# Patient Record
Sex: Female | Born: 1960 | Race: White | Hispanic: No | Marital: Married | Smoking: Never smoker
Health system: Southern US, Community
[De-identification: ages and names within clinical notes are randomized; demographics above are authoritative.]

## PROBLEM LIST (undated history)

## (undated) DIAGNOSIS — N200 Calculus of kidney: Secondary | ICD-10-CM

## (undated) DIAGNOSIS — J302 Other seasonal allergic rhinitis: Secondary | ICD-10-CM

## (undated) DIAGNOSIS — K635 Polyp of colon: Secondary | ICD-10-CM

## (undated) DIAGNOSIS — M8580 Other specified disorders of bone density and structure, unspecified site: Secondary | ICD-10-CM

## (undated) DIAGNOSIS — E559 Vitamin D deficiency, unspecified: Secondary | ICD-10-CM

## (undated) DIAGNOSIS — K5790 Diverticulosis of intestine, part unspecified, without perforation or abscess without bleeding: Secondary | ICD-10-CM

## (undated) HISTORY — PX: BREAST SURGERY: SHX581

## (undated) HISTORY — DX: Polyp of colon: K63.5

## (undated) HISTORY — DX: Vitamin D deficiency, unspecified: E55.9

## (undated) HISTORY — DX: Other seasonal allergic rhinitis: J30.2

## (undated) HISTORY — DX: Diverticulosis of intestine, part unspecified, without perforation or abscess without bleeding: K57.90

## (undated) HISTORY — DX: Calculus of kidney: N20.0

## (undated) HISTORY — DX: Other specified disorders of bone density and structure, unspecified site: M85.80

---
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
## 2009-10-17 ENCOUNTER — Encounter: Admission: RE | Admit: 2009-10-17 | Discharge: 2009-10-17 | Payer: Self-pay | Admitting: Family Medicine

## 2011-03-12 ENCOUNTER — Other Ambulatory Visit: Payer: Self-pay | Admitting: Family Medicine

## 2011-03-12 DIAGNOSIS — Z1231 Encounter for screening mammogram for malignant neoplasm of breast: Secondary | ICD-10-CM

## 2011-03-17 ENCOUNTER — Ambulatory Visit
Admission: RE | Admit: 2011-03-17 | Discharge: 2011-03-17 | Disposition: A | Discharge status: Home / Self Care | Payer: 59 | Source: Ambulatory Visit | Attending: Family Medicine | Admitting: Family Medicine

## 2011-03-17 DIAGNOSIS — Z1231 Encounter for screening mammogram for malignant neoplasm of breast: Secondary | ICD-10-CM

## 2011-09-18 ENCOUNTER — Ambulatory Visit (HOSPITAL_COMMUNITY)
Admission: RE | Admit: 2011-09-18 | Discharge: 2011-09-18 | Disposition: A | Discharge status: Home / Self Care | Payer: 59 | Source: Ambulatory Visit | Attending: Family Medicine | Admitting: Family Medicine

## 2011-09-18 ENCOUNTER — Encounter (HOSPITAL_COMMUNITY): Payer: Self-pay

## 2011-09-18 ENCOUNTER — Other Ambulatory Visit: Payer: Self-pay | Admitting: Family Medicine

## 2011-09-18 DIAGNOSIS — N200 Calculus of kidney: Secondary | ICD-10-CM

## 2011-09-18 DIAGNOSIS — D259 Leiomyoma of uterus, unspecified: Secondary | ICD-10-CM | POA: Insufficient documentation

## 2011-09-18 DIAGNOSIS — R109 Unspecified abdominal pain: Secondary | ICD-10-CM | POA: Insufficient documentation

## 2011-09-18 DIAGNOSIS — M949 Disorder of cartilage, unspecified: Secondary | ICD-10-CM | POA: Insufficient documentation

## 2011-09-18 DIAGNOSIS — M5126 Other intervertebral disc displacement, lumbar region: Secondary | ICD-10-CM | POA: Insufficient documentation

## 2011-09-18 DIAGNOSIS — N201 Calculus of ureter: Secondary | ICD-10-CM | POA: Insufficient documentation

## 2011-09-18 DIAGNOSIS — M51379 Other intervertebral disc degeneration, lumbosacral region without mention of lumbar back pain or lower extremity pain: Secondary | ICD-10-CM | POA: Insufficient documentation

## 2011-09-18 DIAGNOSIS — M899 Disorder of bone, unspecified: Secondary | ICD-10-CM | POA: Insufficient documentation

## 2011-09-18 DIAGNOSIS — M5137 Other intervertebral disc degeneration, lumbosacral region: Secondary | ICD-10-CM | POA: Insufficient documentation

## 2012-03-31 ENCOUNTER — Encounter: Payer: 59 | Admitting: Gastroenterology

## 2012-08-26 DIAGNOSIS — N200 Calculus of kidney: Secondary | ICD-10-CM | POA: Insufficient documentation

## 2012-08-26 DIAGNOSIS — M8580 Other specified disorders of bone density and structure, unspecified site: Secondary | ICD-10-CM | POA: Insufficient documentation

## 2012-09-21 ENCOUNTER — Telehealth: Payer: Self-pay | Admitting: Medical Oncology

## 2012-09-21 NOTE — Telephone Encounter (Signed)
erroneous

## 2012-10-13 DIAGNOSIS — K5790 Diverticulosis of intestine, part unspecified, without perforation or abscess without bleeding: Secondary | ICD-10-CM

## 2012-10-13 HISTORY — DX: Diverticulosis of intestine, part unspecified, without perforation or abscess without bleeding: K57.90

## 2012-10-22 DIAGNOSIS — K635 Polyp of colon: Secondary | ICD-10-CM

## 2012-10-22 HISTORY — DX: Polyp of colon: K63.5

## 2013-02-28 IMAGING — CT CT ABD-PELV W/O CM
2 of 4 series · 16 of 46 positions shown, 18 images · non-contrast
Comparison: None.

CLINICAL DATA: Right flank pain

CT ABDOMEN AND PELVIS WITHOUT CONTRAST
TECHNIQUE: Multidetector CT imaging of the abdomen and pelvis was
performed following the standard protocol without intravenous
contrast.

[Series 2: under 200# stone no prev · axial · 0.67mm/px · z∈[-434,-74]mm · 13 of 78 slices shown, 15 images]
[im 3/78  soft-tissue]
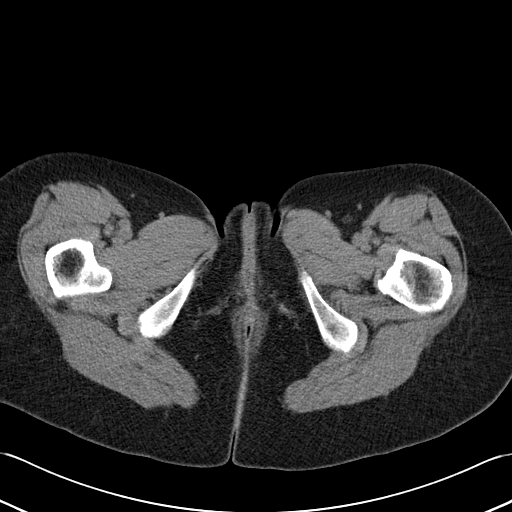
[im 3/78  bone]
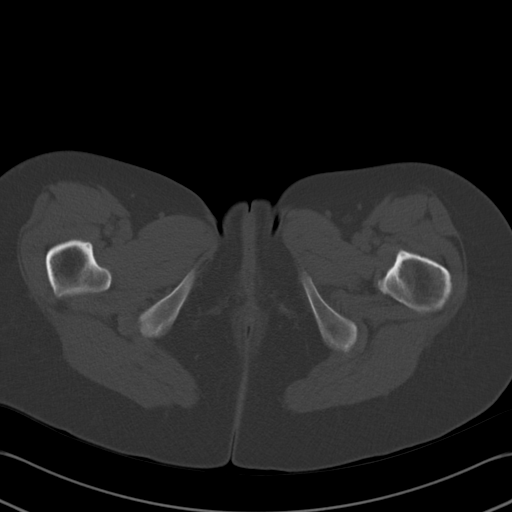
[im 9/78  soft-tissue]
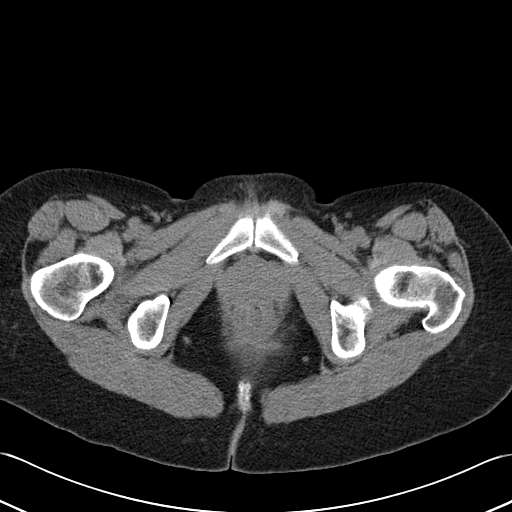
[im 15/78  soft-tissue]
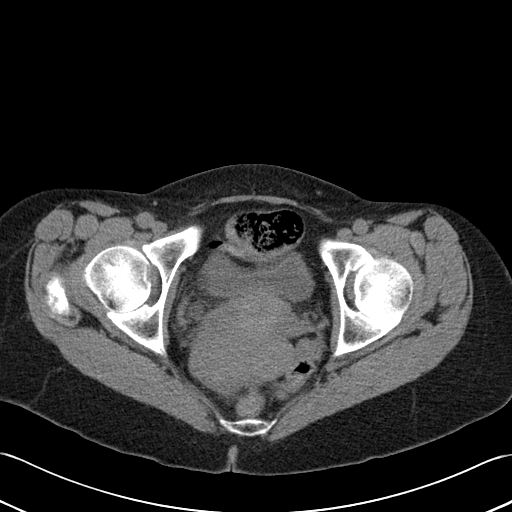
[im 21/78  soft-tissue]
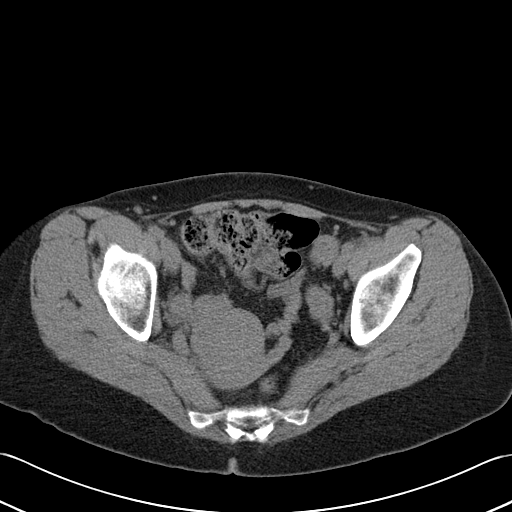
[im 27/78  soft-tissue]
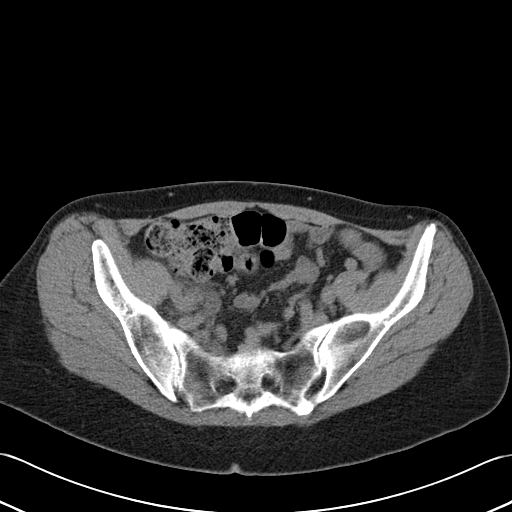
[im 33/78  soft-tissue]
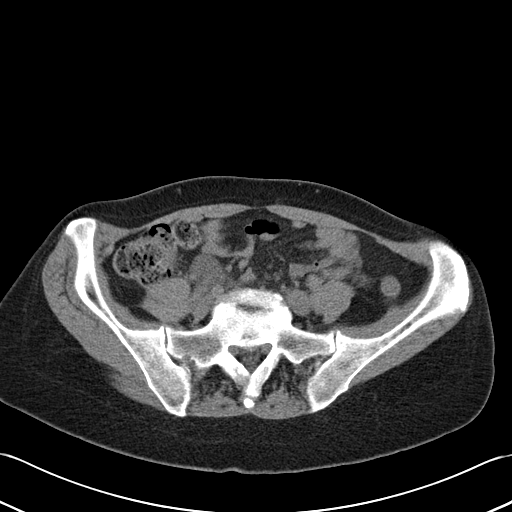
[im 39/78  soft-tissue]
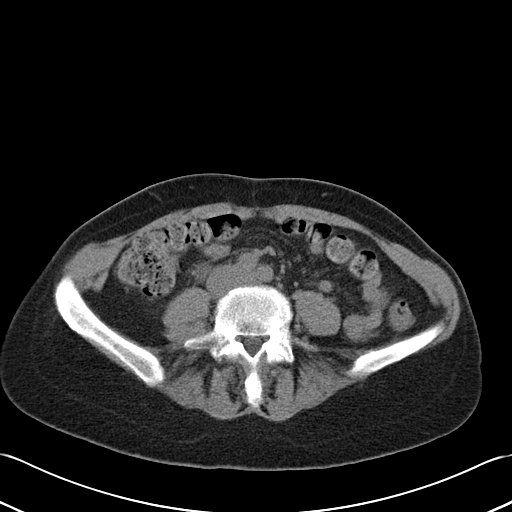
[im 45/78  soft-tissue]
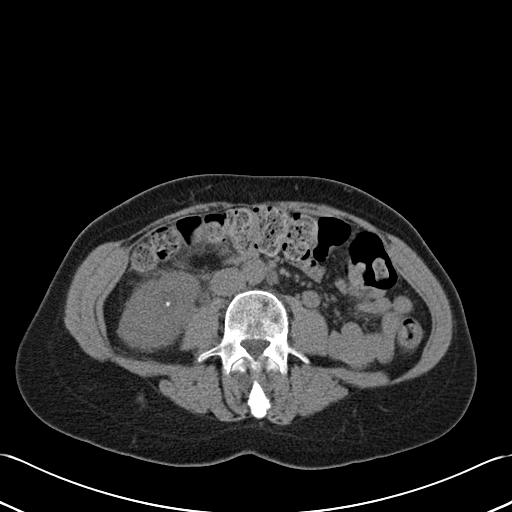
[im 51/78  soft-tissue]
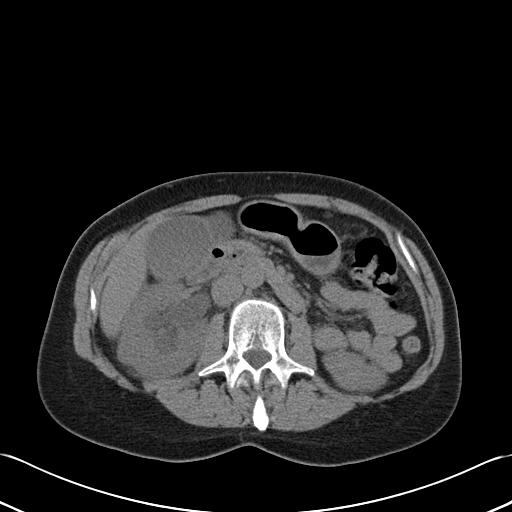
[im 51/78  bone]
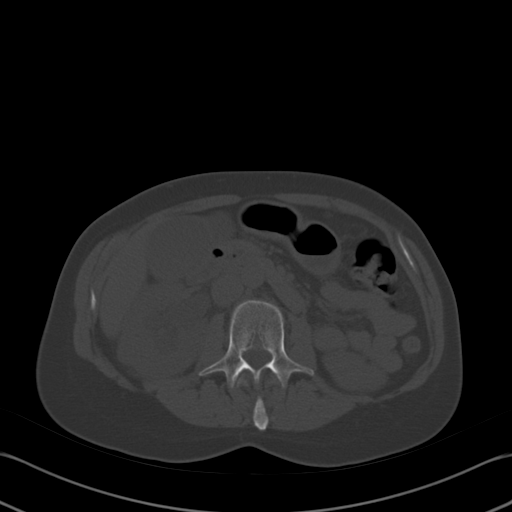
[im 57/78  soft-tissue]
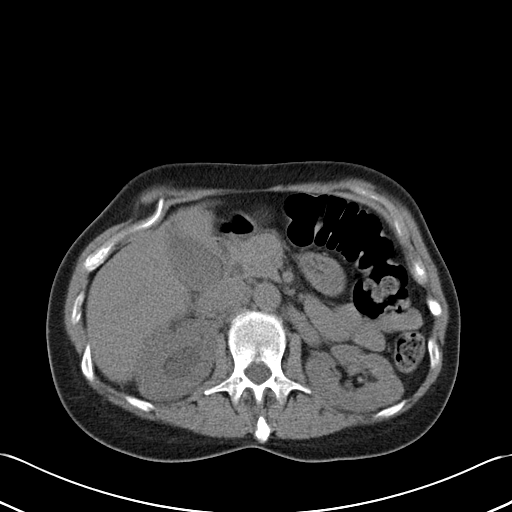
[im 63/78  soft-tissue]
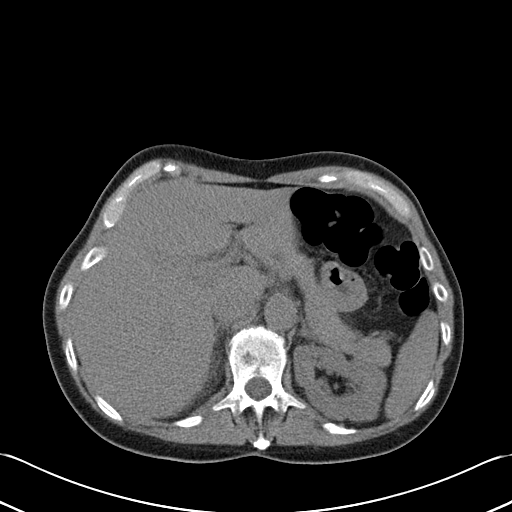
[im 69/78  soft-tissue]
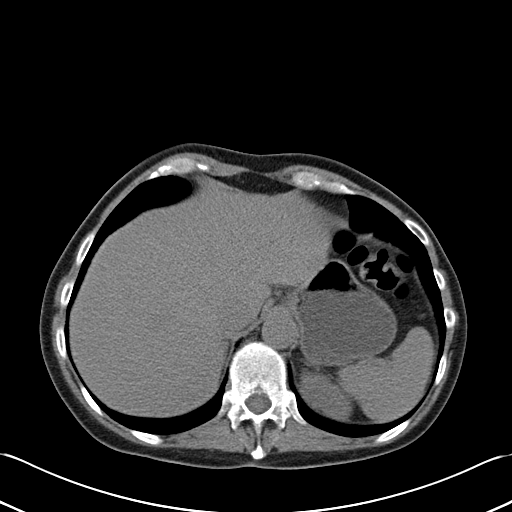
[im 75/78  soft-tissue]
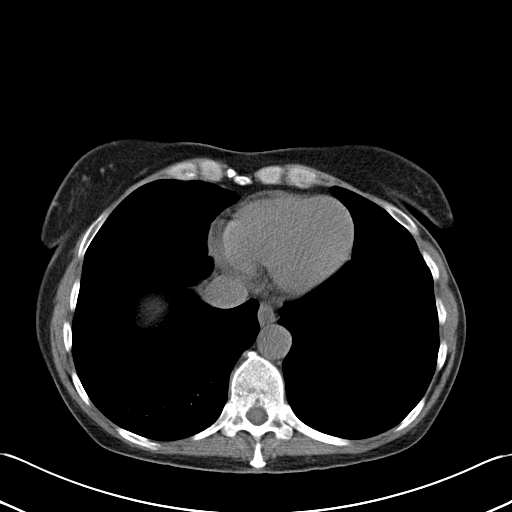

[Series 602: <mpr thick range> · coronal · 0.76mm/px · 3 of 79 slices shown]
[im 27/79  soft-tissue]
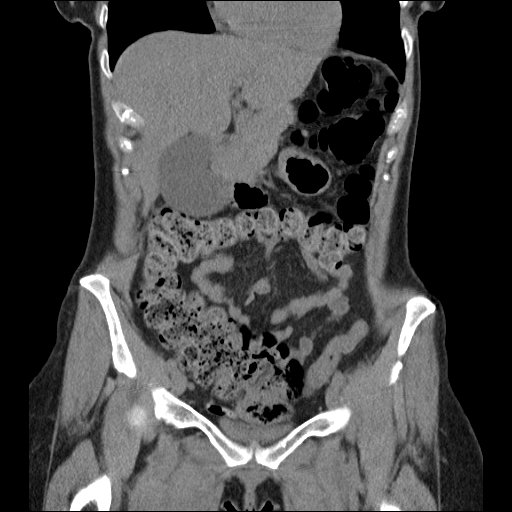
[im 35/79  soft-tissue]
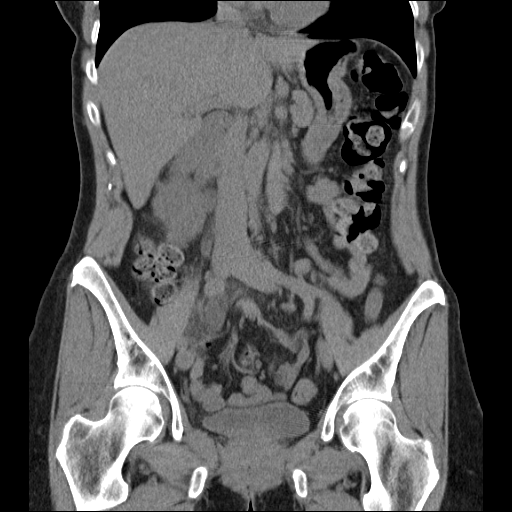
[im 44/79  soft-tissue]
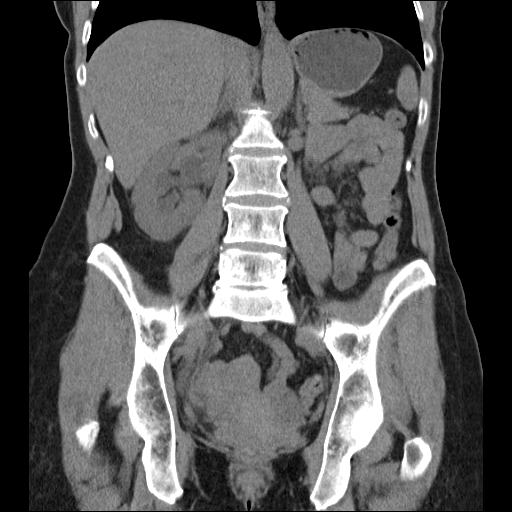

[16 of 46 positions shown; findings below may reference images not displayed]

FINDINGS: Lung bases clear.  Normal heart size.  No pericardial or
pleural effusion.  No hiatal hernia.

Abdomen:  Right kidney demonstrates acute obstructive
hydronephrosis and associated hydroureter.  Perinephric and
periureteral inflammatory stranding noted.  This is secondary to a
distal right UVJ calculus measuring 8.6 x 5.3 mm, image 66.
Additional punctate nonobstructing renal calculus in the right
kidney lower pole measuring 3 mm, image 35.  Left kidney
demonstrates no obstructing urinary tract calculus or acute
process.

Liver, distended gallbladder, biliary system, pancreas, spleen, and
adrenal glands within normal limits for noncontrast study.

Left renal vein is retroaortic, a normal variant.

Negative for bowel obstruction, dilatation, ileus pattern, or free
air.

No abdominal free fluid, fluid collection, hemorrhage, abscess,
hematoma, or adenopathy.

Retained stool throughout the colon.  Normal appendix demonstrated.

Pelvis:  Redemonstration of the right UVJ calculus described above.
No pelvic free fluid, fluid collection, hemorrhage, abscess,
adenopathy, inguinal abnormality, hernia. Mildly enlarged lobular
uterine contour suspicious for uterine fibroids.  Bladder
decompressed.

Degenerative changes at L5 S1 with a calcified disc herniation
suspected posteriorly.  Hypodense central vertebral body lesion
with mildly scalloped borders noted at L1.  This roughly measures
13 x 11 x 17 mm.  Lesion remains nonspecific.  Hounsfield units do
not confirm fat attenuation.  Suspect atypical appearing
hemangioma.  Recommend follow-up non emergent lumbar spine MRI for
confirmation.
IMPRESSION: Obstructing 8.6 x 5.3 mm right UVJ calculus with associated right
hydronephrosis and hydroureter.  Additional punctate nonobstructing
right lower pole nephrolithiasis.

L1 vertebral body indeterminate lucent lesion.  Please see above
comment recommendation.

L5 S1 degenerative disc disease with a central calcified disc
herniation.

Suspect small uterine fibroids

## 2013-09-20 DIAGNOSIS — E559 Vitamin D deficiency, unspecified: Secondary | ICD-10-CM | POA: Insufficient documentation

## 2013-09-20 HISTORY — DX: Vitamin D deficiency, unspecified: E55.9

## 2013-09-20 LAB — LIPID PANEL
Cholesterol: 190 mg/dL (ref 0–200)
HDL: 76 mg/dL — AB (ref 35–70)
LDL CALC: 104 mg/dL
TRIGLYCERIDES: 52 mg/dL (ref 40–160)

## 2013-10-04 DIAGNOSIS — M8580 Other specified disorders of bone density and structure, unspecified site: Secondary | ICD-10-CM | POA: Insufficient documentation

## 2013-10-04 HISTORY — DX: Other specified disorders of bone density and structure, unspecified site: M85.80

## 2014-11-16 LAB — LIPID PANEL
Cholesterol: 193 mg/dL (ref 0–200)
HDL: 86 mg/dL — AB (ref 35–70)
LDL CALC: 97 mg/dL
Triglycerides: 51 mg/dL (ref 40–160)

## 2016-03-21 ENCOUNTER — Ambulatory Visit (INDEPENDENT_AMBULATORY_CARE_PROVIDER_SITE_OTHER): Payer: No Typology Code available for payment source | Admitting: Family Medicine

## 2016-03-21 ENCOUNTER — Encounter: Payer: Self-pay | Admitting: Family Medicine

## 2016-03-21 VITALS — BP 125/87 | HR 87 | Temp 98.3°F | Resp 20 | Ht 64.0 in | Wt 128.0 lb

## 2016-03-21 DIAGNOSIS — Z0000 Encounter for general adult medical examination without abnormal findings: Secondary | ICD-10-CM | POA: Diagnosis not present

## 2016-03-21 DIAGNOSIS — R350 Frequency of micturition: Secondary | ICD-10-CM | POA: Diagnosis not present

## 2016-03-21 DIAGNOSIS — R8290 Unspecified abnormal findings in urine: Secondary | ICD-10-CM

## 2016-03-21 LAB — POC URINALSYSI DIPSTICK (AUTOMATED)
Bilirubin, UA: NEGATIVE
Glucose, UA: NEGATIVE
Ketones, UA: NEGATIVE
NITRITE UA: NEGATIVE
PH UA: 6.5
PROTEIN UA: NEGATIVE
Spec Grav, UA: 1.02
UROBILINOGEN UA: 0.2

## 2016-03-21 MED ORDER — CEPHALEXIN 500 MG PO CAPS: 500.0000 mg | ORAL_CAPSULE | Freq: Four times a day (QID) | ORAL | 0 refills | Status: DC

## 2016-03-21 NOTE — Progress Notes (Signed)
Patient ID: Donna Rodriguez, female  DOB: 02/10/1960, 56 y.o.   MRN: [id number] Patient Care Team    Relationship Specialty Notifications Start End  Ma Hillock, DO PCP - General Family Medicine  03/21/16     Subjective:  Donna Rodriguez is a 56 y.o.  female present for new patient establishment. All past medical history, surgical history, allergies, family history, immunizations, medications and social history were obtained in the electronic medical record today. All recent labs, ED visits and hospitalizations within the last year were reviewed.  Dysuria: pt states she has experienced mild burning with urination and urinary frequency for over 1 week. She has a h/o kidney stones in the past, but this is not consistent with her usual symptoms. She denies fever, chills, nausea, lower back/abd pain. She states her urine has a foul odor.    Depression screen PHQ 2/9 03/21/2016  Decreased Interest 0  Down, Depressed, Hopeless 0  PHQ - 2 Score 0   Current Exercise Habits: Home exercise routine, Type of exercise: walking;calisthenics, Time (Minutes): 30, Frequency (Times/Week): 4, Weekly Exercise (Minutes/Week): 120, Intensity: Moderate       Immunization History  Administered Date(s) Administered  . Influenza, Seasonal, Injecte, Preservative Fre 11/29/2014  . Tdap 08/27/2003, 07/13/2013     Past Medical History:  Diagnosis Date  . Allergy   . Kidney stones   . Osteopenia 10/04/2013   -1.7 (spine), -2.2 (femur)   Allergies  Allergen Reactions  . Penicillins Rash   Past Surgical History:  Procedure Laterality Date  . BREAST SURGERY  2001,2006   biopsy x 2  . LEG SURGERY  1975   injury,  bone calleous formation and removal.    Family History  Problem Relation Age of Onset  . Hypertension Father   . Heart disease Maternal Grandmother   . Arthritis Maternal Grandfather   . Early death Maternal Grandfather    Social History   Social History  . Marital status: Married   Spouse name: Mitzi Hansen  . Number of children: 2  . Years of education: 13   Occupational History  . financial advisor    Social History Main Topics  . Smoking status: Never Smoker  . Smokeless tobacco: Never Used  . Alcohol use Not on file  . Drug use: No  . Sexual activity: Yes    Partners: Male    Birth control/ protection: None     Comment: Married   Other Topics Concern  . Not on file   Social History Narrative   Married to [place]. 2 children [place] and [place].    Master's degree.  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    Drinks caffeine. Uses herbal remedies.    exercises routinely.    wears her seatbelt, bicycle helmet, smoke detector in the home.    Feels safe in her relationships.       Allergies as of 03/21/2016      Reactions   Penicillins Rash      Medication List       Accurate as of 03/21/16 11:07 AM. Always use your most recent med list.          cephALEXin 500 MG capsule Commonly known as:  KEFLEX Take 1 capsule (500 mg total) by mouth 4 (four) times daily.        Recent Results (from the past 2160 hour(s))  POCT Urinalysis Dipstick (Automated)     Status: Abnormal   Collection Time: 03/21/16 10:20 AM  Result Value Ref  Range   Color, UA yellow    Clarity, UA clear    Glucose, UA negative    Bilirubin, UA negative    Ketones, UA negative    Spec Grav, UA 1.020    Blood, UA trace-intact    pH, UA 6.5    Protein, UA negative    Urobilinogen, UA 0.2    Nitrite, UA negative    Leukocytes, UA Trace (A) Negative    Ct Abdomen Pelvis Wo Contrast Result Date: 09/18/2011 * IMPRESSION: Obstructing 8.6 x 5.3 mm right UVJ calculus with associated right hydronephrosis and hydroureter.  Additional punctate nonobstructing right lower pole nephrolithiasis. L1 vertebral body indeterminate lucent lesion.  Please see above comment recommendation. L5 S1 degenerative disc disease with a central calcified disc herniation. Suspect small uterine fibroids Original Report  Authenticated By: Jerilynn Mages. Daryll Brod, M.D.     ROS: 14 pt review of systems performed and negative (unless mentioned in an HPI)  Objective: BP 125/87 (BP Location: Right Arm, Patient Position: Sitting, Cuff Size: Normal)   Pulse 87   Temp 98.3 F (36.8 C)   Resp 20   Ht 5\' 4"  (1.626 m)   Wt 128 lb (58.1 kg)   SpO2 99%   BMI 21.97 kg/m  Gen: Afebrile. No acute distress. Nontoxic in appearance, well-developed, well-nourished, pleasant caucasian female.  HENT: AT. [place]. MMM Eyes:Pupils Equal Round Reactive to light, Extraocular movements intact,  Conjunctiva without redness, discharge or icterus. CV: RRR  Chest: CTAB, no wheeze, rhonchi or crackles.  Abd: Soft. NTND. BS present.  Neuro/Msk:Normal gait. PERLA. EOMi. Alert. Oriented x3.   Psych: Normal affect, dress and demeanor. Normal speech. Normal thought content and judgment.  Results for orders placed or performed in visit on 03/21/16 (from the past 24 hour(s))  POCT Urinalysis Dipstick (Automated)     Status: Abnormal   Collection Time: 03/21/16 10:20 AM  Result Value Ref Range   Color, UA yellow    Clarity, UA clear    Glucose, UA negative    Bilirubin, UA negative    Ketones, UA negative    Spec Grav, UA 1.020    Blood, UA trace-intact    pH, UA 6.5    Protein, UA negative    Urobilinogen, UA 0.2    Nitrite, UA negative    Leukocytes, UA Trace (A) Negative    Assessment/plan: Donna Rodriguez is a 56 y.o. female present for establishment.  Urinary frequency/anl urine - discussed urine POCT with pt, will tx with keflex today, urine culture. Pt will be called with results.  - POCT Urinalysis Dipstick (Automated) - cephALEXin (KEFLEX) 500 MG capsule; Take 1 capsule (500 mg total) by mouth 4 (four) times daily.  Dispense: 28 capsule; Refill: 0 - Urine Culture - F/U PRN on acute issue.    Return in about 3 months (around 06/21/2016) for CPE.  Electronically signed by: Howard Pouch, DO [facility]

## 2016-03-21 NOTE — Patient Instructions (Signed)
It was a pleasure to meet you today.  We will treat you for presumed UTI. I have called in keflex for you to start.  We call you with culture results regardless.   Try OTC Azo for discomfort.   Schedule CPE with fasting labs when due or within next 3 months, whichever comes first.    Please help [place] help you:  We are honored you have chosen [facility] for your Primary Care home. Below you will find basic instructions that you may need to access in the future. Please help [place] help you by reading the instructions, which cover many of the frequent questions we experience.   Prescription refills and request:  -In order to allow more efficient response time, please call your pharmacy for all refills. They will forward the request electronically to [place]. This allows for the quickest possible response. Request left on a nurse line can take longer to refill, since these are checked as time allows between office patients and other phone calls.  - refill request can take up to 3-5 working days to complete.  - If request is sent electronically and request is appropiate, it is usually completed in 1-2 business days.  - all patients will need to be seen routinely for all chronic medical conditions requiring prescription medications (see follow-up below). If you are overdue for follow up on your condition, you will be asked to make an appointment and we will call in enough medication to cover you until your appointment (up to 30 days).  - all controlled substances will require a face to face visit to request/refill.  - if you desire your prescriptions to go through a new pharmacy, and have an active script at original pharmacy, you will need to call your pharmacy and have scripts transferred to new pharmacy. This is completed between the pharmacy locations and not by your provider.    Results: If any images or labs were ordered, it can take up to 1 week to get results depending on the test ordered and the  lab/facility running and resulting the test. - Normal or stable results, which do not need further discussion, will be released to your mychart immediately with attached note to you. A call will not be generated for normal results. Please make certain to sign up for mychart. If you have questions on how to activate your mychart you can call the front office.  - If your results need further discussion, our office will attempt to contact you via phone, and if unable to reach you after 2 attempts, we will release your abnormal result to your mychart with instructions.  - All results will be automatically released in mychart after 1 week.  - Your provider will provide you with explanation and instruction on all relevant material in your results. Please keep in mind, results and labs may appear confusing or abnormal to the untrained eye, but it does not mean they are actually abnormal for you personally. If you have any questions about your results that are not covered, or you desire more detailed explanation than what was provided, you should make an appointment with your provider to do so.   Our office handles many outgoing and incoming calls daily. If we have not contacted you within 1 week about your results, please check your mychart to see if there is a message first and if not, then contact our office.  In helping with this matter, you help decrease call volume, and therefore allow  [place] to be able to respond to patients needs more efficiently.   Acute office visits (sick visit):  An acute visit is intended for a new problem and are scheduled in shorter time slots to allow schedule openings for patients with new problems. This is the appropriate visit to discuss a new problem. In order to provide you with excellent quality medical care with proper time for you to explain your problem, have an exam and receive treatment with instructions, these appointments should be limited to one new problem per visit. If  you experience a new problem, in which you desire to be addressed, please make an acute office visit, we save openings on the schedule to accommodate you. Please do not save your new problem for any other type of visit, let us take care of it properly and quickly for you.   Follow up visits:  Depending on your condition(s) your provider will need to see you routinely in order to provide you with quality care and prescribe medication(s). Most chronic conditions (Example: hypertension, Diabetes, depression/anxiety... etc), require visits a couple times a year. Your provider will instruct you on proper follow up for your personal medical conditions and history. Please make certain to make follow up appointments for your condition as instructed. Failing to do so could result in lapse in your medication treatment/refills. If you request a refill, and are overdue to be seen on a condition, we will always provide you with a 30 day script (once) to allow you time to schedule.    Medicare wellness (well visit): - we have a wonderful Nurse Maudie Mercury), that will meet with you and provide you will yearly medicare wellness visits. These visits should occur yearly (can not be scheduled less than 1 calendar year apart) and cover preventive health, immunizations, advance directives and screenings you are entitled to yearly through your medicare benefits. Do not miss out on your entitled benefits, this is when medicare will pay for these benefits to be ordered for you.  These are strongly encouraged by your provider and is the appropriate type of visit to make certain you are up to date with all preventive health benefits. If you have not had your medicare wellness exam in the last 12 months, please make certain to schedule one by calling the office and schedule your medicare wellness with Maudie Mercury as soon as possible.   Yearly physical (well visit):  - Adults are recommended to be seen yearly for physicals. Check with your  insurance and date of your last physical, most insurances require one calendar year between physicals. Physicals include all preventive health topics, screenings, medical exam and labs that are appropriate for gender/age and history. You may have fasting labs needed at this visit. This is a well visit (not a sick visit), acute topics should not be covered during this visit.  - Pediatric patients are seen more frequently when they are younger. Your provider will advise you on well child visit timing that is appropriate for your their age. - This is not a medicare wellness visit. Medicare wellness exams do not have an exam portion to the visit. Some medicare companies allow for a physical, some do not allow a yearly physical. If your medicare allows a yearly physical you can schedule the medicare wellness with our nurse Maudie Mercury and have your physical with your provider after, on the same day. Please check with insurance for your full benefits.   Late Policy/No Shows:  - all new patients should arrive 15-30  minutes earlier than appointment to allow [place] time  to  obtain all personal demographics,  insurance information and for you to complete office paperwork. - All established patients should arrive 10-15 minutes earlier than appointment time to update all information and be checked in .  - In our best efforts to run on time, if you are late for your appointment you will be asked to either reschedule or if able, we will work you back into the schedule. There will be a wait time to work you back in the schedule,  depending on availability.  - If you are unable to make it to your appointment as scheduled, please call 24 hours ahead of time to allow [place] to fill the time slot with someone else who needs to be seen. If you do not cancel your appointment ahead of time, you may be charged a no show fee.

## 2016-03-23 LAB — URINE CULTURE: Organism ID, Bacteria: NO GROWTH

## 2016-03-24 ENCOUNTER — Telehealth: Payer: Self-pay | Admitting: Family Medicine

## 2016-03-24 NOTE — Telephone Encounter (Signed)
Please call pt: - her urine culture is normal, no bacteria overgrowth.  - she can continue the abx if she is seeing improvement. If no improvement would need to to see again and investigate further.

## 2016-03-24 NOTE — Telephone Encounter (Signed)
Message left on voice mail for patient to return call. 

## 2016-03-25 NOTE — Telephone Encounter (Signed)
Patient is returning call from Starla Link.  Thank you,  -LL

## 2016-03-25 NOTE — Telephone Encounter (Signed)
Tried to contact patient and got VM.  LMOM for patient to try to get someone to route call to [facility] so we can get these results to patient.

## 2016-03-25 NOTE — Telephone Encounter (Signed)
Patient states she hasnt been able to take the antibiotic as directed because it is causing her to be dizzy. She states no improvement in symptoms when she took it . Scheduled patient to be seen for evaluation.

## 2016-03-26 ENCOUNTER — Encounter: Payer: Self-pay | Admitting: Family Medicine

## 2016-03-26 ENCOUNTER — Ambulatory Visit (INDEPENDENT_AMBULATORY_CARE_PROVIDER_SITE_OTHER): Payer: PRIVATE HEALTH INSURANCE | Admitting: Family Medicine

## 2016-03-26 ENCOUNTER — Telehealth: Payer: Self-pay | Admitting: Family Medicine

## 2016-03-26 ENCOUNTER — Ambulatory Visit (INDEPENDENT_AMBULATORY_CARE_PROVIDER_SITE_OTHER)
Admission: RE | Admit: 2016-03-26 | Discharge: 2016-03-26 | Disposition: A | Discharge status: Home / Self Care | Payer: PRIVATE HEALTH INSURANCE | Source: Ambulatory Visit | Attending: Family Medicine | Admitting: Family Medicine

## 2016-03-26 VITALS — BP 133/86 | HR 76 | Temp 98.5°F | Resp 20 | Wt 128.2 lb

## 2016-03-26 DIAGNOSIS — R102 Pelvic and perineal pain: Secondary | ICD-10-CM | POA: Diagnosis not present

## 2016-03-26 DIAGNOSIS — R1024 Suprapubic pain: Secondary | ICD-10-CM

## 2016-03-26 DIAGNOSIS — R350 Frequency of micturition: Secondary | ICD-10-CM | POA: Diagnosis not present

## 2016-03-26 DIAGNOSIS — K5900 Constipation, unspecified: Secondary | ICD-10-CM

## 2016-03-26 LAB — BASIC METABOLIC PANEL
BUN: 16 mg/dL (ref 6–23)
CHLORIDE: 105 meq/L (ref 96–112)
CO2: 30 meq/L (ref 19–32)
CREATININE: 0.65 mg/dL (ref 0.40–1.20)
Calcium: 10.2 mg/dL (ref 8.4–10.5)
GFR: 100.28 mL/min (ref >60.00)
Glucose, Bld: 81 mg/dL (ref 70–99)
POTASSIUM: 4.6 meq/L (ref 3.5–5.1)
Sodium: 141 mEq/L (ref 135–145)

## 2016-03-26 LAB — CBC
HCT: 42.2 % (ref 36.0–46.0)
HEMOGLOBIN: 13.9 g/dL (ref 12.0–15.0)
MCHC: 33 g/dL (ref 30.0–36.0)
MCV: 91.5 fl (ref 78.0–100.0)
PLATELETS: 241 10*3/uL (ref 150.0–400.0)
RBC: 4.61 Mil/uL (ref 3.87–5.11)
RDW: 13 % (ref 11.5–15.5)
WBC: 3.9 10*3/uL — AB (ref 4.0–10.5)

## 2016-03-26 MED ORDER — POLYETHYLENE GLYCOL 3350 17 GM/SCOOP PO POWD: 17.0000 g | Freq: Two times a day (BID) | ORAL | 0 refills | Status: DC | PRN

## 2016-03-26 NOTE — Telephone Encounter (Addendum)
Please call pt: - her xray did show a great deal of constipation and 2 potential right sided stones (one in kidney and one possibly in the right lower ureter).  - her labs are normal. - I want her to treat the constipation as we discussed. Given the view on her xray I would recommend capful of miralax every 12 hours until bowels move then decrease to one cap.  Follow up in 1 week if symptoms remain, sooner if worsen with fever, flank/back pain etc and we will order a CT.

## 2016-03-26 NOTE — Patient Instructions (Signed)
It was a pleasure seeing you again today.  Start miralax 1 cap daily in 8 ounces of water. May need to take every 12 hours if movement does not start within 24 hours.   Please have abd xray completed at the [facility] from [facility].    Constipation, Adult Constipation is when a person:  Poops (has a bowel movement) fewer times in a week than normal.  Has a hard time pooping.  Has poop that is dry, hard, or bigger than normal. Follow these instructions at home: Eating and drinking    Eat foods that have a lot of fiber, such as:  Fresh fruits and vegetables.  Whole grains.  Beans.  Eat less of foods that are high in fat, low in fiber, or overly processed, such as:  [place] fries.  Hamburgers.  Cookies.  Candy.  Soda.  Drink enough fluid to keep your pee (urine) clear or pale yellow. General instructions   Exercise regularly or as told by your doctor.  Go to the restroom when you feel like you need to poop. Do not hold it in.  Take over-the-counter and prescription medicines only as told by your doctor. These include any fiber supplements.  Do pelvic floor retraining exercises, such as:  Doing deep breathing while relaxing your lower belly (abdomen).  Relaxing your pelvic floor while pooping.  Watch your condition for any changes.  Keep all follow-up visits as told by your doctor. This is important. Contact a doctor if:  You have pain that gets worse.  You have a fever.  You have not pooped for 4 days.  You throw up (vomit).  You are not hungry.  You lose weight.  You are bleeding from the anus.  You have thin, pencil-like poop (stool). Get help right away if:  You have a fever, and your symptoms suddenly get worse.  You leak poop or have blood in your poop.  Your belly feels hard or bigger than normal (is bloated).  You have very bad belly pain.  You feel dizzy or you faint. This information is not intended  to replace advice given to you by your health care provider. Make sure you discuss any questions you have with your health care provider. Document Released: 06/18/2007 Document Revised: 07/20/2015 Document Reviewed: 06/20/2015 Elsevier Interactive Patient Education  2017 [employer].

## 2016-03-26 NOTE — Progress Notes (Signed)
Syriana Croslin , 25-Sep-1960, 56 y.o., female MRN: [id number] Patient Care Team    Relationship Specialty Notifications Start End  Ma Hillock, DO PCP - General Family Medicine  03/21/16     CC: Urinary frequency Subjective: Pt presents for an  OV with complaints of urinary frequency of ~2 weeks duration.  Associated symptoms include she was seen last week with normal urine culture. Trace blood,  trace leuk on POCT. She has a h/o kidney stones, but she states this is completely different. She feels she has the urge or pressure to urinate and occasional spasm. She has need to urinate more times daily than usual > 5-6 and 3-4x through the night. She reports sometimes she produces a fair amount of urine and sometimes just a small amount. She has noticed constipation of 5 days, and typically she has a BM 4 x week. She was started on kelfex while urine was sent to culture, but felt it made her dizzy. She stopped using medicine and dizziness resolved. She denies nausea,vomit, flank pain, incontinence, burning with urination, urgency, fever or chills.   Depression screen PHQ 2/9 03/21/2016  Decreased Interest 0  Down, Depressed, Hopeless 0  PHQ - 2 Score 0    Allergies  Allergen Reactions  . Penicillins Rash   Social History  Substance Use Topics  . Smoking status: Never Smoker  . Smokeless tobacco: Never Used  . Alcohol use Not on file   Past Medical History:  Diagnosis Date  . Allergy   . Kidney stones   . Osteopenia 10/04/2013   -1.7 (spine), -2.2 (femur)   Past Surgical History:  Procedure Laterality Date  . BREAST SURGERY  2001,2006   biopsy x 2  . LEG SURGERY  1975   injury,  bone calleous formation and removal.    Family History  Problem Relation Age of Onset  . Hypertension Father   . Heart disease Maternal Grandmother   . Arthritis Maternal Grandfather   . Early death Maternal Grandfather    Allergies as of 03/26/2016      Reactions   Penicillins Rash        Medication List       Accurate as of 03/26/16  8:14 AM. Always use your most recent med list.          cephALEXin 500 MG capsule Commonly known as:  KEFLEX Take 1 capsule (500 mg total) by mouth 4 (four) times daily.       No results found for this or any previous visit (from the past 24 hour(s)). No results found.   ROS: Negative, with the exception of above mentioned in HPI   Objective:  BP 133/86 (BP Location: Right Arm, Patient Position: Sitting, Cuff Size: Normal)   Pulse 76   Temp 98.5 F (36.9 C)   Resp 20   Wt 128 lb 4 oz (58.2 kg)   SpO2 100%   BMI 22.01 kg/m  Body mass index is 22.01 kg/m. Gen: Afebrile. No acute distress. Nontoxic in appearance, well developed, well nourished.  HENT: AT. [place].Marland Kitchen MMM Eyes:Pupils Equal Round Reactive to light, Extraocular movements intact,  Conjunctiva without redness, discharge or icterus. CV: RRR  Abd: Soft. flat. Mild discomfort Right abd. ND. BS present. Stool burden palpated Masses palpated. No rebound or guarding.  MSK: no CVA tenderness bilateral.  Neuro: Normal gait. PERLA. EOMi. Alert. Oriented x3    Assessment/Plan: Bedie Dominey is a 56 y.o. female present for OV for  Urinary  frequency/suprapubic pain constipation - DDX reviewed with pt: given UC was normal, no need to prescribe new abx. Per pt it is not classic for her typical kidney stone pain. Discussed possible bladder stone, constipation, bladder irritation as potential causes.  - ABD xray today attempt to visualize potential stone vs constipation.  - CBC - Basic Metabolic Panel (BMET) - DG Abd 2 Views; Future - Bowel regimen with miralax/water- prescribed.  - if all labs/imagining normal and symptoms still do not resolve with relief of constipation, would consider either CT and/or urology referral.   Reviewed expectations re: course of current medical issues.  Discussed self-management of symptoms.  Outlined signs and symptoms indicating need for more  acute intervention.  Patient verbalized understanding and all questions were answered.  Patient received an After-Visit Summary.   electronically signed by:  Howard Pouch, DO  [place]

## 2016-03-27 NOTE — Telephone Encounter (Signed)
Spoke with patient reviewed lab results and instructions. Patient verbalized understanding. 

## 2016-04-24 ENCOUNTER — Encounter: Payer: Self-pay | Admitting: *Deleted

## 2016-04-25 ENCOUNTER — Encounter: Payer: Self-pay | Admitting: Family Medicine

## 2016-04-25 ENCOUNTER — Ambulatory Visit (INDEPENDENT_AMBULATORY_CARE_PROVIDER_SITE_OTHER): Payer: PRIVATE HEALTH INSURANCE | Admitting: Family Medicine

## 2016-04-25 VITALS — BP 131/83 | HR 70 | Temp 98.2°F | Resp 20 | Ht 64.0 in | Wt 129.2 lb

## 2016-04-25 DIAGNOSIS — M8580 Other specified disorders of bone density and structure, unspecified site: Secondary | ICD-10-CM | POA: Diagnosis not present

## 2016-04-25 DIAGNOSIS — Z13220 Encounter for screening for lipoid disorders: Secondary | ICD-10-CM

## 2016-04-25 DIAGNOSIS — Z131 Encounter for screening for diabetes mellitus: Secondary | ICD-10-CM

## 2016-04-25 DIAGNOSIS — Z0001 Encounter for general adult medical examination with abnormal findings: Secondary | ICD-10-CM

## 2016-04-25 DIAGNOSIS — M7741 Metatarsalgia, right foot: Secondary | ICD-10-CM | POA: Insufficient documentation

## 2016-04-25 DIAGNOSIS — Z1231 Encounter for screening mammogram for malignant neoplasm of breast: Secondary | ICD-10-CM

## 2016-04-25 DIAGNOSIS — Z1239 Encounter for other screening for malignant neoplasm of breast: Secondary | ICD-10-CM

## 2016-04-25 LAB — LIPID PANEL
CHOLESTEROL: 225 mg/dL — AB (ref 0–200)
HDL: 91 mg/dL (ref >39.00)
LDL Cholesterol: 126 mg/dL — ABNORMAL HIGH (ref 0–99)
NONHDL: 133.77
Total CHOL/HDL Ratio: 2
Triglycerides: 37 mg/dL (ref 0.0–149.0)
VLDL: 7.4 mg/dL (ref 0.0–40.0)

## 2016-04-25 LAB — VITAMIN D 25 HYDROXY (VIT D DEFICIENCY, FRACTURES): VITD: 24.54 ng/mL — AB (ref 30.00–100.00)

## 2016-04-25 LAB — HEMOGLOBIN A1C: HEMOGLOBIN A1C: 5.3 % (ref 4.6–6.5)

## 2016-04-25 LAB — TSH: TSH: 1.22 u[IU]/mL (ref 0.35–4.50)

## 2016-04-25 NOTE — Progress Notes (Signed)
Patient ID: Donna Rodriguez, female  DOB: [birth year], 56 y.o.   MRN: [id number] Patient Care Team    Relationship Specialty Notifications Start End  Ma Hillock, DO PCP - General Family Medicine  03/21/16     Subjective:  Donna Rodriguez is a 56 y.o.  Female  present for CPE . All past medical history, surgical history, allergies, family history, immunizations, medications and social history were updated in the electronic medical record today. All recent labs, ED visits and hospitalizations within the last year were reviewed.  Patient complains today of toe pain in her right 4th toe. She states it just started to occur about 3-4 weeks ago after increasing her walking routine. She reports occassionally swelling and redness to the area. She has just bought a new pair of shoes, hoping it would help. She is not certain if they are yet. She denies injury or arthritis pain.   Health maintenance:  Colonoscopy: 10/21/2012, reported normal, 10 year.  Mammogram: completed:08/2014, birads 1. [facility]. Would like breast center order . Cervical cancer screening: last pap: 09/2014, results: normal, completed by: FP Immunizations: tdap 07/2013, Influenza utd (encouraged yearly) Infectious disease screening: HIV and  Hep C declined today DEXA: h/o osteopenia. last completed 2015, with 2 year rec rpt.  Assistive device: none Oxygen use:nne Patient has a Dental home. Hospitalizations/ED visits: none  10/04/2013: TECHNIQUE:Standard bone densitometry was performed using [employer] [facility]. INDICATION: 733.90: Disorder of bone and cartilage, unspecified Exam date/time:10/04/2013 9:02 AM  FINDINGS:   LUMBAR SPINE: The bone mineral density of the lumbar spine from L1 through L4 is 0.981 g/cm2. T-score: -1.7 Z-score: -0.8 Omitted due to degenerative disease: None. Comparison: No comparison.  PROXIMAL FEMUR: The bone mineral density as  measured at the left femoral neck region of interest is 0.735 g/cm2. T-score: -2.2 Z-score: -1.1 Comparison: No comparison.  Depression screen [facility] 2/9 04/25/2016 03/21/2016  Decreased Interest 0 0  Down, Depressed, Hopeless 0 0  PHQ - 2 Score 0 0    Current Exercise Habits: Home exercise routine, Type of exercise: calisthenics;walking, Time (Minutes): 30, Frequency (Times/Week): 4, Weekly Exercise (Minutes/Week): 120, Intensity: Moderate     Immunization History  Administered Date(s) Administered  . Influenza, Seasonal, Injecte, Preservative Fre 11/29/2014  . Tdap 08/27/2003, 07/13/2013     Past Medical History:  Diagnosis Date  . Allergy   . Kidney stones   . Osteopenia 10/04/2013   -1.7 (spine), -2.2 (femur)   Allergies  Allergen Reactions  . Penicillins Rash  . Demerol [Meperidine] Other (See Comments)    Hallucinations  . Keflex [Cephalexin] Other (See Comments)    dizziness   Past Surgical History:  Procedure Laterality Date  . BREAST SURGERY  2001,2006   biopsy x 2  . LEG SURGERY  1975   injury,  bone calleous formation and removal.    Family History  Problem Relation Age of Onset  . Hypertension Father   . Heart disease Maternal Grandmother   . Arthritis Maternal Grandfather   . Early death Maternal Grandfather    Social History   Social History  . Marital status: Married    Spouse name: Mitzi Hansen  . Number of children: 2  . Years of education: 3   Occupational History  . financial advisor    Social History Main Topics  . Smoking status: Never Smoker  . Smokeless tobacco: Never Used  . Alcohol use Not on file  .  Drug use: No  . Sexual activity: Yes    Partners: Male    Birth control/ protection: None     Comment: Married   Other Topics Concern  . Not on file   Social History Narrative   Married to Melvin. 2 children Donna Rodriguez and Donna Rodriguez.    Master's degree.  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    Drinks caffeine. Uses herbal remedies.     exercises routinely.    wears her seatbelt, bicycle helmet, smoke detector in the home.    Feels safe in her relationships.       Allergies as of 04/25/2016      Reactions   Penicillins Rash   Demerol [meperidine] Other (See Comments)   Hallucinations   Keflex [cephalexin] Other (See Comments)   dizziness      Medication List       Accurate as of 04/25/16 11:18 AM. Always use your most recent med list.          azelastine 0.05 % ophthalmic solution Commonly known as:  OPTIVAR Place 1 drop into both eyes as needed.   cetirizine 10 MG tablet Commonly known as:  ZYRTEC Take 10 mg by mouth daily.        Recent Results (from the past 2160 hour(s))  POCT Urinalysis Dipstick (Automated)     Status: Abnormal   Collection Time: 03/21/16 10:20 AM  Result Value Ref Range   Color, UA yellow    Clarity, UA clear    Glucose, UA negative    Bilirubin, UA negative    Ketones, UA negative    Spec Grav, UA 1.020    Blood, UA trace-intact    pH, UA 6.5    Protein, UA negative    Urobilinogen, UA 0.2    Nitrite, UA negative    Leukocytes, UA Trace (A) Negative  Urine Culture     Status: None   Collection Time: 03/21/16 10:36 AM  Result Value Ref Range   Organism ID, Bacteria NO GROWTH   CBC     Status: Abnormal   Collection Time: 03/26/16  8:35 AM  Result Value Ref Range   WBC 3.9 (L) 4.0 - 10.5 K/uL   RBC 4.61 3.87 - 5.11 Mil/uL   Platelets 241.0 150.0 - 400.0 K/uL   Hemoglobin 13.9 12.0 - 15.0 g/dL   HCT 42.2 36.0 - 46.0 %   MCV 91.5 78.0 - 100.0 fl   MCHC 33.0 30.0 - 36.0 g/dL   RDW 13.0 11.5 - 95.0 %  Basic Metabolic Panel (BMET)     Status: None   Collection Time: 03/26/16  8:35 AM  Result Value Ref Range   Sodium 141 135 - 145 mEq/L   Potassium 4.6 3.5 - 5.1 mEq/L   Chloride 105 96 - 112 mEq/L   CO2 30 19 - 32 mEq/L   Glucose, Bld 81 70 - 99 mg/dL   BUN 16 6 - 23 mg/dL   Creatinine, Ser 0.65 0.40 - 1.20 mg/dL   Calcium 10.2 8.4 - 10.5 mg/dL   GFR 100.28  >60.00 mL/min    Dg Abd 2 Views  Result Date: 03/26/2016 CLINICAL DATA:  Frequent urination, bladder pressure. EXAM: ABDOMEN - 2 VIEW COMPARISON:  CT abdomen pelvis 09/18/2011. FINDINGS: Stool is seen throughout the colon. No small bowel dilatation. A 7 mm calcification is seen in the expected location of the right kidney. Calcification in the right anatomic pelvis measures 5 mm and may be within the distal right ureter. IMPRESSION:  1. Probable right renal stone. Possible distal right ureteral stone. 2. Stool throughout the colon is indicative of constipation. Electronically Signed   By: Lorin Picket M.D.   On: 03/26/2016 15:57     ROS: 14 pt review of systems performed and negative (unless mentioned in an HPI)  Objective: BP 131/83 (BP Location: Left Arm, Patient Position: Sitting, Cuff Size: Normal)   Pulse 70   Temp 98.2 F (36.8 C)   Resp 20   Ht 5\' 4"  (1.626 m)   Wt 129 lb 4 oz (58.6 kg)   LMP 02/24/2011   SpO2 100%   BMI 22.19 kg/m  Gen: Afebrile. No acute distress. Nontoxic in appearance, well-developed, well-nourished,  pleasant caucasian female. HENT: AT. [place]. Bilateral TM visualized and normal in appearance, normal external auditory canal. MMM, no oral lesions, adequate dentition. Bilateral nares within normal limits. Throat without erythema, ulcerations or exudates. no Cough on exam, no hoarseness on exam. Eyes:Pupils Equal Round Reactive to light, Extraocular movements intact,  Conjunctiva without redness, discharge or icterus. Neck/lymp/endocrine: Supple,no lymphadenopathy, no thyromegaly CV: RRR no murmur, no edema, +2/4 P posterior tibialis pulses. no carotid bruits. No JVD. Chest: CTAB, no wheeze, rhonchi or crackles. normal Respiratory effort. good Air movement. Abd: Soft. flat. NTND. BS present. no Masses palpated. No hepatosplenomegaly. No rebound tenderness or guarding. Skin: no rashes, purpura or petechiae. Warm and well-perfused. Skin intact. Neuro/Msk:  Normal  gait. PERLA. EOMi. Alert. Oriented x3.  Cranial nerves II through XII intact. Muscle strength 5/5 upper/lower extremity. TTP right 4 th metatarsal head plantar aspect foot. No erythema or swelling. DTRs equal bilaterally. Psych: Normal affect, dress and demeanor. Normal speech. Normal thought content and judgment. Breast: Pt declined breast exam today.    Assessment/plan: Rhian Asebedo is a 56 y.o. female present for CPE with a complaint.  Annual visit for general adult medical examination with abnormal findings Patient was encouraged to exercise greater than 150 minutes a week. Patient was encouraged to choose a diet filled with fresh fruits and vegetables, and lean meats. AVS provided to patient today for education/recommendation on gender specific health and safety maintenance. mammogram and Dexa ordered for breast center. UTD immunizations.  PAP due 2019- 2021 ID declined.  Lipid screening - if normal consider screen every 1-3 years, FHX HD - Lipid panel Screening for diabetes mellitus - HgB A1c Osteopenia, unspecified location - Last DEXA 2015 -2.2 left femur. Rpt 2-3 years.  - TSH - DG Bone Density; Future - Vitamin D (25 hydroxy) - pt to continue vit D supplement, caution on calcium supplement with kidney stones. Breast cancer screening - MM Digital Screening; Future Metatarsalgia, right foot - new - discussed diagnosis and education from external source provided.  - pt encourage to replace tennis shoes at least  every 3-4 months if walking routinely, depending on mileage. Purchase foot pads to cushion 4th metatarsal of right foot.  - NSAIDS for discomfort if can tolerate. Rest, ice, elevate if acutely uncomfortable.  - If worsens would consider xray and/or SM referral, consider orthotics  No Follow-up on file.  Electronically signed by: Howard Pouch, DO [place]

## 2016-04-25 NOTE — Patient Instructions (Addendum)
Please help [place] help you:  We are honored you have chosen [facility] for your Primary Care home. Below you will find basic instructions that you may need to access in the future. Please help [place] help you by reading the instructions, which cover many of the frequent questions we experience.   Prescription refills and request:  -In order to allow more efficient response time, please call your pharmacy for all refills. They will forward the request electronically to [place]. This allows for the quickest possible response. Request left on a nurse line can take longer to refill, since these are checked as time allows between office patients and other phone calls.  - refill request can take up to 3-5 working days to complete.  - If request is sent electronically and request is appropiate, it is usually completed in 1-2 business days.  - all patients will need to be seen routinely for all chronic medical conditions requiring prescription medications (see follow-up below). If you are overdue for follow up on your condition, you will be asked to make an appointment and we will call in enough medication to cover you until your appointment (up to 30 days).  - all controlled substances will require a face to face visit to request/refill.  - if you desire your prescriptions to go through a new pharmacy, and have an active script at original pharmacy, you will need to call your pharmacy and have scripts transferred to new pharmacy. This is completed between the pharmacy locations and not by your provider.    Results: If any images or labs were ordered, it can take up to 1 week to get results depending on the test ordered and the lab/facility running and resulting the test. - Normal or stable results, which do not need further discussion, will be released to your mychart immediately with attached note to you. A call will not be generated for normal results. Please make certain to sign up for mychart. If you have  questions on how to activate your mychart you can call the front office.  - If your results need further discussion, our office will attempt to contact you via phone, and if unable to reach you after 2 attempts, we will release your abnormal result to your mychart with instructions.  - All results will be automatically released in mychart after 1 week.  - Your provider will provide you with explanation and instruction on all relevant material in your results. Please keep in mind, results and labs may appear confusing or abnormal to the untrained eye, but it does not mean they are actually abnormal for you personally. If you have any questions about your results that are not covered, or you desire more detailed explanation than what was provided, you should make an appointment with your provider to do so.   Our office handles many outgoing and incoming calls daily. If we have not contacted you within 1 week about your results, please check your mychart to see if there is a message first and if not, then contact our office.  In helping with this matter, you help decrease call volume, and therefore allow [place] to be able to respond to patients needs more efficiently.   Acute office visits (sick visit):  An acute visit is intended for a new problem and are scheduled in shorter time slots to allow schedule openings for patients with new problems. This is the appropriate visit to discuss a new problem. In order to provide you with  excellent quality medical care with proper time for you to explain your problem, have an exam and receive treatment with instructions, these appointments should be limited to one new problem per visit. If you experience a new problem, in which you desire to be addressed, please make an acute office visit, we save openings on the schedule to accommodate you. Please do not save your new problem for any other type of visit, let us take care of it properly and quickly for you.   Follow up  visits:  Depending on your condition(s) your provider will need to see you routinely in order to provide you with quality care and prescribe medication(s). Most chronic conditions (Example: hypertension, Diabetes, depression/anxiety... etc), require visits a couple times a year. Your provider will instruct you on proper follow up for your personal medical conditions and history. Please make certain to make follow up appointments for your condition as instructed. Failing to do so could result in lapse in your medication treatment/refills. If you request a refill, and are overdue to be seen on a condition, we will always provide you with a 30 day script (once) to allow you time to schedule.    Medicare wellness (well visit): - we have a wonderful Nurse Maudie Mercury), that will meet with you and provide you will yearly medicare wellness visits. These visits should occur yearly (can not be scheduled less than 1 calendar year apart) and cover preventive health, immunizations, advance directives and screenings you are entitled to yearly through your medicare benefits. Do not miss out on your entitled benefits, this is when medicare will pay for these benefits to be ordered for you.  These are strongly encouraged by your provider and is the appropriate type of visit to make certain you are up to date with all preventive health benefits. If you have not had your medicare wellness exam in the last 12 months, please make certain to schedule one by calling the office and schedule your medicare wellness with Maudie Mercury as soon as possible.   Yearly physical (well visit):  - Adults are recommended to be seen yearly for physicals. Check with your insurance and date of your last physical, most insurances require one calendar year between physicals. Physicals include all preventive health topics, screenings, medical exam and labs that are appropriate for gender/age and history. You may have fasting labs needed at this visit. This is a  well visit (not a sick visit), acute topics should not be covered during this visit.  - Pediatric patients are seen more frequently when they are younger. Your provider will advise you on well child visit timing that is appropriate for your their age. - This is not a medicare wellness visit. Medicare wellness exams do not have an exam portion to the visit. Some medicare companies allow for a physical, some do not allow a yearly physical. If your medicare allows a yearly physical you can schedule the medicare wellness with our nurse Maudie Mercury and have your physical with your provider after, on the same day. Please check with insurance for your full benefits.   Late Policy/No Shows:  - all new patients should arrive 15-30 minutes earlier than appointment to allow [place] time  to  obtain all personal demographics,  insurance information and for you to complete office paperwork. - All established patients should arrive 10-15 minutes earlier than appointment time to update all information and be checked in .  - In our best efforts to run on time, if you are late  for your appointment you will be asked to either reschedule or if able, we will work you back into the schedule. There will be a wait time to work you back in the schedule,  depending on availability.  - If you are unable to make it to your appointment as scheduled, please call 24 hours ahead of time to allow [place] to fill the time slot with someone else who needs to be seen. If you do not cancel your appointment ahead of time, you may be charged a no show fee.   Health Maintenance, Female Adopting a healthy lifestyle and getting preventive care can go a long way to promote health and wellness. Talk with your health care provider about what schedule of regular examinations is right for you. This is a good chance for you to check in with your provider about disease prevention and staying healthy. In between checkups, there are plenty of things you can do on your  own. Experts have done a lot of research about which lifestyle changes and preventive measures are most likely to keep you healthy. Ask your health care provider for more information. Weight and diet Eat a healthy diet  Be sure to include plenty of vegetables, fruits, low-fat dairy products, and lean protein.  Do not eat a lot of foods high in solid fats, added sugars, or salt.  Get regular exercise. This is one of the most important things you can do for your health.  Most adults should exercise for at least 150 minutes each week. The exercise should increase your heart rate and make you sweat (moderate-intensity exercise).  Most adults should also do strengthening exercises at least twice a week. This is in addition to the moderate-intensity exercise. Maintain a healthy weight  Body mass index (BMI) is a measurement that can be used to identify possible weight problems. It estimates body fat based on height and weight. Your health care provider can help determine your BMI and help you achieve or maintain a healthy weight.  For females 71 years of age and older:  A BMI below 18.5 is considered underweight.  A BMI of 18.5 to 24.9 is normal.  A BMI of 25 to 29.9 is considered overweight.  A BMI of 30 and above is considered obese. Watch levels of cholesterol and blood lipids  You should start having your blood tested for lipids and cholesterol at 56 years of age, then have this test every 5 years.  You may need to have your cholesterol levels checked more often if:  Your lipid or cholesterol levels are high.  You are older than 56 years of age.  You are at high risk for heart disease. Cancer screening Lung Cancer  Lung cancer screening is recommended for adults 35-8 years old who are at high risk for lung cancer because of a history of smoking.  A yearly low-dose CT scan of the lungs is recommended for people who:  Currently smoke.  Have quit within the past 15  years.  Have at least a 30-pack-year history of smoking. A pack year is smoking an average of one pack of cigarettes a day for 1 year.  Yearly screening should continue until it has been 15 years since you quit.  Yearly screening should stop if you develop a health problem that would prevent you from having lung cancer treatment. Breast Cancer  Practice breast self-awareness. This means understanding how your breasts normally appear and feel.  It also means doing regular breast self-exams.  Let your health care provider know about any changes, no matter how small.  If you are in your 20s or 30s, you should have a clinical breast exam (CBE) by a health care provider every 1-3 years as part of a regular health exam.  If you are 63 or older, have a CBE every year. Also consider having a breast X-ray (mammogram) every year.  If you have a family history of breast cancer, talk to your health care provider about genetic screening.  If you are at high risk for breast cancer, talk to your health care provider about having an MRI and a mammogram every year.  Breast cancer gene (BRCA) assessment is recommended for women who have family members with BRCA-related cancers. BRCA-related cancers include:  Breast.  Ovarian.  Tubal.  Peritoneal cancers.  Results of the assessment will determine the need for genetic counseling and BRCA1 and BRCA2 testing. Cervical Cancer  Your health care provider may recommend that you be screened regularly for cancer of the pelvic organs (ovaries, uterus, and vagina). This screening involves a pelvic examination, including checking for microscopic changes to the surface of your cervix (Pap test). You may be encouraged to have this screening done every 3 years, beginning at age 44.  For women ages 58-65, health care providers may recommend pelvic exams and Pap testing every 3 years, or they may recommend the Pap and pelvic exam, combined with testing for human  papilloma virus (HPV), every 5 years. Some types of HPV increase your risk of cervical cancer. Testing for HPV may also be done on women of any age with unclear Pap test results.  Other health care providers may not recommend any screening for nonpregnant women who are considered low risk for pelvic cancer and who do not have symptoms. Ask your health care provider if a screening pelvic exam is right for you.  If you have had past treatment for cervical cancer or a condition that could lead to cancer, you need Pap tests and screening for cancer for at least 20 years after your treatment. If Pap tests have been discontinued, your risk factors (such as having a new sexual partner) need to be reassessed to determine if screening should resume. Some women have medical problems that increase the chance of getting cervical cancer. In these cases, your health care provider may recommend more frequent screening and Pap tests. Colorectal Cancer  This type of cancer can be detected and often prevented.  Routine colorectal cancer screening usually begins at 56 years of age and continues through 56 years of age.  Your health care provider may recommend screening at an earlier age if you have risk factors for colon cancer.  Your health care provider may also recommend using home test kits to check for hidden blood in the stool.  A small camera at the end of a tube can be used to examine your colon directly (sigmoidoscopy or colonoscopy). This is done to check for the earliest forms of colorectal cancer.  Routine screening usually begins at age 65.  Direct examination of the colon should be repeated every 5-10 years through 56 years of age. However, you may need to be screened more often if early forms of precancerous polyps or small growths are found. Skin Cancer  Check your skin from head to toe regularly.  Tell your health care provider about any new moles or changes in moles, especially if there is a  change in a mole's shape or color.  Also  tell your health care provider if you have a mole that is larger than the size of a pencil eraser.  Always use sunscreen. Apply sunscreen liberally and repeatedly throughout the day.  Protect yourself by wearing long sleeves, pants, a wide-brimmed hat, and sunglasses whenever you are outside. Heart disease, diabetes, and high blood pressure  High blood pressure causes heart disease and increases the risk of stroke. High blood pressure is more likely to develop in:  People who have blood pressure in the high end of the normal range (130-139/85-89 mm Hg).  People who are overweight or obese.  People who are African American.  If you are 28-63 years of age, have your blood pressure checked every 3-5 years. If you are 1 years of age or older, have your blood pressure checked every year. You should have your blood pressure measured twice-once when you are at a hospital or clinic, and once when you are not at a hospital or clinic. Record the average of the two measurements. To check your blood pressure when you are not at a hospital or clinic, you can use:  An automated blood pressure machine at a pharmacy.  A home blood pressure monitor.  If you are between 32 years and 10 years old, ask your health care provider if you should take aspirin to prevent strokes.  Have regular diabetes screenings. This involves taking a blood sample to check your fasting blood sugar level.  If you are at a normal weight and have a low risk for diabetes, have this test once every three years after 56 years of age.  If you are overweight and have a high risk for diabetes, consider being tested at a younger age or more often. Preventing infection Hepatitis B  If you have a higher risk for hepatitis B, you should be screened for this virus. You are considered at high risk for hepatitis B if:  You were born in a country where hepatitis B is common. Ask your health care  provider which countries are considered high risk.  Your parents were born in a high-risk country, and you have not been immunized against hepatitis B (hepatitis B vaccine).  You have HIV or AIDS.  You use needles to inject street drugs.  You live with someone who has hepatitis B.  You have had sex with someone who has hepatitis B.  You get hemodialysis treatment.  You take certain medicines for conditions, including cancer, organ transplantation, and autoimmune conditions. Hepatitis C  Blood testing is recommended for:  Everyone born from [birth year] through 1965.  Anyone with known risk factors for hepatitis C. Sexually transmitted infections (STIs)  You should be screened for sexually transmitted infections (STIs) including gonorrhea and chlamydia if:  You are sexually active and are younger than 56 years of age.  You are older than 56 years of age and your health care provider tells you that you are at risk for this type of infection.  Your sexual activity has changed since you were last screened and you are at an increased risk for chlamydia or gonorrhea. Ask your health care provider if you are at risk.  If you do not have HIV, but are at risk, it may be recommended that you take a prescription medicine daily to prevent HIV infection. This is called pre-exposure prophylaxis (PrEP). You are considered at risk if:  You are sexually active and do not regularly use condoms or know the HIV status of your partner(s).  You take  drugs by injection.  You are sexually active with a partner who has HIV. Talk with your health care provider about whether you are at high risk of being infected with HIV. If you choose to begin PrEP, you should first be tested for HIV. You should then be tested every 3 months for as long as you are taking PrEP. Pregnancy  If you are premenopausal and you may become pregnant, ask your health care provider about preconception counseling.  If you may become  pregnant, take 400 to 800 micrograms (mcg) of folic acid every day.  If you want to prevent pregnancy, talk to your health care provider about birth control (contraception). Osteoporosis and menopause  Osteoporosis is a disease in which the bones lose minerals and strength with aging. This can result in serious bone fractures. Your risk for osteoporosis can be identified using a bone density scan.  If you are 65 years of age or older, or if you are at risk for osteoporosis and fractures, ask your health care provider if you should be screened.  Ask your health care provider whether you should take a calcium or vitamin D supplement to lower your risk for osteoporosis.  Menopause may have certain physical symptoms and risks.  Hormone replacement therapy may reduce some of these symptoms and risks. Talk to your health care provider about whether hormone replacement therapy is right for you. Follow these instructions at home:  Schedule regular health, dental, and eye exams.  Stay current with your immunizations.  Do not use any tobacco products including cigarettes, chewing tobacco, or electronic cigarettes.  If you are pregnant, do not drink alcohol.  If you are breastfeeding, limit how much and how often you drink alcohol.  Limit alcohol intake to no more than 1 drink per day for nonpregnant women. One drink equals 12 ounces of beer, 5 ounces of wine, or 1 ounces of hard liquor.  Do not use street drugs.  Do not share needles.  Ask your health care provider for help if you need support or information about quitting drugs.  Tell your health care provider if you often feel depressed.  Tell your health care provider if you have ever been abused or do not feel safe at home. This information is not intended to replace advice given to you by your health care provider. Make sure you discuss any questions you have with your health care provider. Document Released: 07/15/2010 Document  Revised: 06/07/2015 Document Reviewed: 10/03/2014 Elsevier Interactive Patient Education  2017 [employer]  

## 2016-04-28 ENCOUNTER — Telehealth: Payer: Self-pay | Admitting: Family Medicine

## 2016-04-28 MED ORDER — AZELASTINE HCL 0.05 % OP SOLN: 1.0000 [drp] | OPHTHALMIC | 0 refills | Status: AC | PRN

## 2016-04-28 NOTE — Telephone Encounter (Signed)
Spoke with patient she states she is no longer seeing any other Drs and would need [place] to follow her for all her medical needs. Patient aware Rx called in.

## 2016-04-28 NOTE — Telephone Encounter (Addendum)
Please call patient: Her labs are all stable. Her vitamin D is mildly low at 24.5, I would encourage her to take 600u units of vitamin D daily with a meal. I would not encourage a calcium supplementation with her vitamin D given she has kidney stones. Her cholesterol appears elevated, however the majority of this is from a high good cholesterol, which is ideal.  Lipid Panel     Component Value Date/Time   CHOL 225 (H) 04/25/2016 1149   TRIG 37.0 04/25/2016 1149   HDL 91.00 04/25/2016 1149   CHOLHDL 2 04/25/2016 1149   VLDL 7.4 04/25/2016 1149   LDLCALC 126 (H) 04/25/2016 1149

## 2016-04-28 NOTE — Telephone Encounter (Signed)
Spoke with patient reviewed results and instructions patient verbalized understanding. 

## 2016-04-28 NOTE — Telephone Encounter (Signed)
Patient called stating that her eye drops were never called into [facility] [employer].   Please advise.

## 2016-04-28 NOTE — Telephone Encounter (Signed)
Patient called in asking for her eyedrops to be refilled. She was seen for her preventative exam last week, she did not mention eyedrops at that time. Nor have I prescribed eyedrops for her in the past. I do see she had some allergy eyedrops on her medication list. I'll refill them for her, however if she's receiving them from a different provider she should follow-up with them.

## 2016-05-01 ENCOUNTER — Telehealth: Payer: Self-pay | Admitting: Family Medicine

## 2016-05-01 NOTE — Telephone Encounter (Signed)
Patient needs a signed order for dexa to be sent to [facility] fax [phone].

## 2016-05-02 NOTE — Telephone Encounter (Signed)
Order signed by Dr Raoul Pitch and faxed by Shauna Hugh

## 2016-06-13 LAB — HM DEXA SCAN

## 2016-06-13 LAB — HM MAMMOGRAPHY

## 2016-06-16 ENCOUNTER — Encounter: Payer: Self-pay | Admitting: *Deleted

## 2016-06-16 ENCOUNTER — Telehealth: Payer: Self-pay | Admitting: Family Medicine

## 2016-06-16 NOTE — Telephone Encounter (Signed)
Please call pt: - her DEXA scan did show a decrease in her bone density over the past few years. The right femur/hip area is the worse location and is just a fraction away from osteoporosis. She is still technically consider osteopenic, but barely.  I would recommend she increase her Vit d to 1000 u daily. She can not take calcium secondary to kidney stones. Weight bearing exercise (walking) also helpful.  There are other medication options that can be considered such as fosamax etc to help stop the progression to osteopenia. If she would like to discuss options please have her schedule an appt.  - repeat dexa 3 years

## 2016-06-17 ENCOUNTER — Encounter: Payer: Self-pay | Admitting: *Deleted

## 2016-06-17 NOTE — Telephone Encounter (Signed)
Left message and sent message with results in [facility] Chart

## 2016-07-09 ENCOUNTER — Telehealth: Payer: Self-pay | Admitting: Family Medicine

## 2016-07-09 NOTE — Telephone Encounter (Signed)
Patient walked in to request to switch PCP from Dr. Howard Pouch at [facility] and Briscoe Deutscher at [facility].  Please acknowledge patients request at your earliest convenience.  Thank you,  -LL

## 2016-07-09 NOTE — Telephone Encounter (Signed)
Ok with me 

## 2016-07-09 NOTE — Telephone Encounter (Signed)
Okay 

## 2016-07-14 NOTE — Telephone Encounter (Signed)
Patient has been made aware. No further action required.  °

## 2016-11-11 ENCOUNTER — Ambulatory Visit (INDEPENDENT_AMBULATORY_CARE_PROVIDER_SITE_OTHER): Payer: No Typology Code available for payment source | Admitting: Family Medicine

## 2016-11-11 ENCOUNTER — Telehealth: Payer: Self-pay | Admitting: Family Medicine

## 2016-11-11 ENCOUNTER — Encounter: Payer: Self-pay | Admitting: Family Medicine

## 2016-11-11 VITALS — BP 122/74 | HR 82 | Temp 98.5°F | Ht 64.0 in | Wt 133.4 lb

## 2016-11-11 DIAGNOSIS — N841 Polyp of cervix uteri: Secondary | ICD-10-CM

## 2016-11-11 DIAGNOSIS — N950 Postmenopausal bleeding: Secondary | ICD-10-CM | POA: Diagnosis not present

## 2016-11-11 NOTE — Telephone Encounter (Signed)
Patient called stating she was having "irregular bleeding" and she is "post menopausal". Sent patient to triage. Awaiting note from triage.

## 2016-11-11 NOTE — Progress Notes (Signed)
Donna Rodriguez is a 56 y.o. female is here to [place].   Patient Care Team: Briscoe Deutscher, DO as PCP - General (Family Medicine)   History of Present Illness:   HPI: Patient presents with a few days history of vaginal bleeding, similar to a light period, since intercourse. No pain. No discharge. Postmenopausal x 5 years. Still with cervix and uterus. No new medications or supplements. No systemic symptoms. No personal or family history of estrogen related cancers. Not concerned about STIs.   Health Maintenance Due  Topic Date Due  . INFLUENZA VACCINE  08/13/2016   Depression screen PHQ 2/9 11/11/2016  Decreased Interest 0  Down, Depressed, Hopeless 0  PHQ - 2 Score 0   PMHx, SurgHx, SocialHx, Medications, and Allergies were reviewed in the Visit Navigator and updated as appropriate.   Past Medical History:  Diagnosis Date  . Colon polyp 10/22/2012   transverse colon; hyperplastic   . Diverticulosis 10/2012   on colonoscopy  . Kidney stones   . Osteopenia 10/04/2013   -1.7 (spine), -2.2 (femur)  . Seasonal allergies   . Vitamin D deficiency 09/20/2013   mildly low    Past Surgical History:  Procedure Laterality Date  . BREAST SURGERY  2001,2006   biopsy x 2  . LEG SURGERY  1975   injury,  bone calleous formation and removal.     Family History  Problem Relation Age of Onset  . Hypertension Father   . Heart disease Maternal Grandmother   . Arthritis Maternal Grandfather   . Early death Maternal Grandfather    Social History  Substance Use Topics  . Smoking status: Never Smoker  . Smokeless tobacco: Never Used  . Alcohol use Not on file    Current Medications and Allergies:   .  azelastine (OPTIVAR) 0.05 % ophthalmic solution, Place 1 drop into both eyes as needed., Disp: 6 mL, Rfl: 0 .  cetirizine (ZYRTEC) 10 MG tablet, Take 10 mg by mouth daily., Disp: , Rfl:    Allergies  Allergen Reactions  . Penicillins Rash  . Demerol  [Meperidine] Other (See Comments)    Hallucinations  . Keflex [Cephalexin] Other (See Comments)    dizziness   Review of Systems:   Pertinent items are noted in the HPI. Otherwise, ROS is negative.  Vitals:   Vitals:   11/11/16 1436  BP: 122/74  Pulse: 82  Temp: 98.5 F (36.9 C)  TempSrc: Oral  SpO2: 99%  Weight: 133 lb 6.4 oz (60.5 kg)  Height: 5\' 4"  (1.626 m)     Body mass index is 22.9 kg/m.   Physical Exam:   Physical Exam  Constitutional: She appears well-nourished.  HENT:  Head: Normocephalic and atraumatic.  Eyes: Pupils are equal, round, and reactive to light. EOM are normal.  Neck: Normal range of motion. Neck supple.  Cardiovascular: Normal rate, regular rhythm, normal heart sounds and intact distal pulses.   Pulmonary/Chest: Effort normal.  Abdominal: Soft.  Genitourinary:  Genitourinary Comments: Large, bleeding cervical polyp.  Skin: Skin is warm.  Psychiatric: She has a normal mood and affect. Her behavior is normal.  Nursing note and vitals reviewed.  Assessment and Plan:   Diagnoses and all orders for this visit:  Postmenopausal bleeding -     Ambulatory referral to Gynecology  Cervical polyp -     Ambulatory referral to Gynecology    . Reviewed expectations re: course of current medical issues. . Discussed self-management of symptoms. . Outlined  signs and symptoms indicating need for more acute intervention. . Patient verbalized understanding and all questions were answered. Marland Kitchen Health Maintenance issues including appropriate healthy diet, exercise, and smoking avoidance were discussed with patient. . See orders for this visit as documented in the electronic medical record. . Patient received an After Visit Summary.   Briscoe Deutscher, DO [place], [place] 11/11/2016

## 2016-11-11 NOTE — Telephone Encounter (Signed)
FYI, pt has an appt today at 2:20 pm with Dr. Juleen China.

## 2016-11-11 NOTE — Telephone Encounter (Signed)
Duplicate

## 2016-11-11 NOTE — Telephone Encounter (Signed)
Noted  

## 2016-11-11 NOTE — Telephone Encounter (Signed)
Patient Name: Donna Rodriguez DOB: 1960/08/18 Initial Comment Caller states she's having bleeding and started after intercourse on Sunday. She's post menopausal. This has never happened before. Almost like a regular cycle. Nurse Assessment Nurse: Joline Salt, RN, Malachy Mood Date/Time Eilene Ghazi Time): 11/11/2016 12:13:01 PM Confirm and document reason for call. If symptomatic, describe symptoms. ---Caller states she's having bleeding and started after intercourse on Sunday. She's post menopausal. This has never happened before. Almost like a regular period. It is requiring a light pad. It is still bright red blood. No pain or cramping. Does the patient have any new or worsening symptoms? ---Yes Will a triage be completed? ---Yes Related visit to physician within the last 2 weeks? ---N/A Does the PT have any chronic conditions? (i.e. diabetes, asthma, etc.) ---No Is this a behavioral health or substance abuse call? ---No Guidelines Guideline Title Affirmed Question Affirmed Notes Vaginal Bleeding - Postmenopausal Postmenopausal vaginal bleeding Final Disposition User See PCP within [place], RN, Malachy Mood Comments 5 years since periods stopped Appt scheduled for today at 2:20 with PCP Referrals REFERRED TO PCP OFFICE REFERRED TO PCP OFFICE Caller Disagree/Comply Comply Caller Understands Yes PreDisposition Did not know what to do

## 2016-11-17 ENCOUNTER — Other Ambulatory Visit (HOSPITAL_COMMUNITY)
Admission: RE | Admit: 2016-11-17 | Discharge: 2016-11-17 | Disposition: A | Discharge status: Home / Self Care | Payer: No Typology Code available for payment source | Source: Ambulatory Visit | Attending: Obstetrics and Gynecology | Admitting: Obstetrics and Gynecology

## 2016-11-17 ENCOUNTER — Ambulatory Visit (INDEPENDENT_AMBULATORY_CARE_PROVIDER_SITE_OTHER): Payer: No Typology Code available for payment source | Admitting: Obstetrics and Gynecology

## 2016-11-17 ENCOUNTER — Encounter: Payer: Self-pay | Admitting: Obstetrics and Gynecology

## 2016-11-17 VITALS — BP 142/78 | HR 72 | Resp 16 | Ht 63.25 in | Wt 131.0 lb

## 2016-11-17 DIAGNOSIS — Z124 Encounter for screening for malignant neoplasm of cervix: Secondary | ICD-10-CM | POA: Insufficient documentation

## 2016-11-17 DIAGNOSIS — N950 Postmenopausal bleeding: Secondary | ICD-10-CM | POA: Diagnosis not present

## 2016-11-17 DIAGNOSIS — N930 Postcoital and contact bleeding: Secondary | ICD-10-CM | POA: Diagnosis not present

## 2016-11-17 DIAGNOSIS — N841 Polyp of cervix uteri: Secondary | ICD-10-CM

## 2016-11-17 NOTE — Progress Notes (Signed)
56 y.o. [id number] MarriedCaucasianF here for a consultation from Dr Juleen China for postmenopausal bleeding and cervical polyp.  Menopausal for almost 6 years, no bleeding until a little over a week ago. She started bleeding after intercourse, light flow, minimal yesterday. No pain. No dyspareunia.     Patient's last menstrual period was 02/24/2011.          Sexually active: Yes.    The current method of family planning is post menopausal status.    Exercising: Yes.    walking/ cardio/ weights  Smoker:  no  Health Maintenance: Pap:  10-04-13 WNL NEG HR HPV History of abnormal Pap:  no MMG:  06-13-16 WNL  Colonoscopy:  10-21-12 polyps  BMD:   06-13-16 WNL  TDaP:  07-13-13 Gardasil: N/A   reports that  has never smoked. she has never used smokeless tobacco. She reports that she drinks about 1.2 - 1.8 oz of alcohol per week. She reports that she does not use drugs.3 kids, husband is a PA at [employer], she works in Engineer, mining.   Past Medical History:  Diagnosis Date  . Colon polyp 10/22/2012   transverse colon; hyperplastic   . Diverticulosis 10/2012   on colonoscopy  . Kidney stones   . Osteopenia 10/04/2013   -1.7 (spine), -2.2 (femur)  . Seasonal allergies   . Vitamin D deficiency 09/20/2013   mildly low    Past Surgical History:  Procedure Laterality Date  . BREAST SURGERY  2001,2006   biopsy x 2  . LEG SURGERY  1975   injury,  bone calleous formation and removal.     Current Outpatient Medications  Medication Sig Dispense Refill  . azelastine (OPTIVAR) 0.05 % ophthalmic solution Place 1 drop into both eyes as needed. 6 mL 0  . cetirizine (ZYRTEC) 10 MG tablet Take 10 mg by mouth daily.     No current facility-administered medications for this visit.     Family History  Problem Relation Age of Onset  . Hypertension Father   . Heart disease Maternal Grandmother   . Arthritis Maternal Grandfather   . Early death Maternal Grandfather     Review of Systems  Constitutional: Negative.    HENT: Negative.   Eyes: Negative.   Respiratory: Negative.   Cardiovascular: Negative.   Gastrointestinal: Negative.   Endocrine: Negative.   Genitourinary:       Postmenopause bleeding   Musculoskeletal: Negative.   Skin: Negative.   Allergic/Immunologic: Negative.   Neurological: Negative.   Psychiatric/Behavioral: Negative.     Exam:   BP (!) 142/78 (BP Location: Right Arm, Patient Position: Sitting, Cuff Size: Normal)   Pulse 72   Resp 16   Ht 5' 3.25" (1.607 m)   Wt 131 lb (59.4 kg)   LMP 02/24/2011   BMI 23.02 kg/m   Weight change: @WEIGHTCHANGE @ Height:   Height: 5' 3.25" (160.7 cm)  Ht Readings from Last 3 Encounters:  11/17/16 5' 3.25" (1.607 m)  11/11/16 5\' 4"  (1.626 m)  04/25/16 5\' 4"  (1.626 m)    General appearance: alert, cooperative and appears stated age Abdomen: soft, non-tender; non distended,  no masses,  no organomegaly   Pelvic: External genitalia:  no lesions              Urethra:  normal appearing urethra with no masses, tenderness or lesions              Bartholins and Skenes: normal  Vagina: normal appearing vagina with normal color and discharge, no lesions              Cervix: 3 polyps seen and removed, none appeared friable.  There was some brown blood in the vagina. Not clearly coming from the cervical polyps.                Bimanual Exam:  Uterus:  normal size, contour, position, consistency, mobility, non-tender and retroverted              Adnexa: no mass, fullness, tenderness               Rectovaginal: Confirms               Anus:  normal sphincter tone, no lesions  Procedure: all polyps removed with a small ringed forceps. Minimal bleeding from the base of one of the polyps was treated with silver nitrate.   Chaperone was present for exam.  A:  Postmenopausal, postcoital bleeding  Cervical polyps  P:   Pap with hpv  Polyps removed  Because it wasn't obvious that the bleeding was coming from the polyps, I have  recommended a TV ultrasound (if thickened lining we discussed possible sonohysterogram or endometrial biopsy).   CC: Dr Juleen China Note sent

## 2016-11-20 LAB — CYTOLOGY - PAP
DIAGNOSIS: NEGATIVE
HPV (WINDOPATH): NOT DETECTED

## 2016-11-24 ENCOUNTER — Ambulatory Visit (INDEPENDENT_AMBULATORY_CARE_PROVIDER_SITE_OTHER): Payer: No Typology Code available for payment source

## 2016-11-24 ENCOUNTER — Other Ambulatory Visit: Payer: Self-pay

## 2016-11-24 ENCOUNTER — Other Ambulatory Visit: Payer: Self-pay | Admitting: Obstetrics and Gynecology

## 2016-11-24 ENCOUNTER — Encounter: Payer: Self-pay | Admitting: Obstetrics and Gynecology

## 2016-11-24 ENCOUNTER — Ambulatory Visit: Payer: No Typology Code available for payment source | Admitting: Obstetrics and Gynecology

## 2016-11-24 VITALS — BP 142/88 | HR 64 | Resp 14 | Wt 131.0 lb

## 2016-11-24 DIAGNOSIS — N930 Postcoital and contact bleeding: Secondary | ICD-10-CM

## 2016-11-24 DIAGNOSIS — N882 Stricture and stenosis of cervix uteri: Secondary | ICD-10-CM

## 2016-11-24 DIAGNOSIS — N950 Postmenopausal bleeding: Secondary | ICD-10-CM

## 2016-11-24 NOTE — Progress Notes (Signed)
jj

## 2016-11-24 NOTE — Progress Notes (Signed)
GYNECOLOGY  VISIT   HPI: 56 y.o.   Married  Caucasian  female   [phone] with Patient's last menstrual period was 02/24/2011.   here for f/u of PMP bleeding. She had 3 endometrial polyps removed, benign pathology, normal pap. She is here for an ultrasound (the polyps didn't appear friable)  GYNECOLOGIC HISTORY: Patient's last menstrual period was 02/24/2011. Contraception:NA Menopausal hormone therapy: no        OB History    Gravida Para Term Preterm AB Living   3 3 3     3    SAB TAB Ectopic Multiple Live Births           3         Patient Active Problem List   Diagnosis Date Noted  . Metatarsalgia, right foot 04/25/2016  . Constipation 03/26/2016  . Osteopenia 10/04/2013  . Vitamin D deficiency 09/20/2013    Past Medical History:  Diagnosis Date  . Colon polyp 10/22/2012   transverse colon; hyperplastic   . Diverticulosis 10/2012   on colonoscopy  . Kidney stones   . Osteopenia 10/04/2013   -1.7 (spine), -2.2 (femur)  . Seasonal allergies   . Vitamin D deficiency 09/20/2013   mildly low    Past Surgical History:  Procedure Laterality Date  . BREAST SURGERY  2001,2006   biopsy x 2  . LEG SURGERY  1975   injury,  bone calleous formation and removal.     Current Outpatient Medications  Medication Sig Dispense Refill  . azelastine (OPTIVAR) 0.05 % ophthalmic solution Place 1 drop into both eyes as needed. 6 mL 0  . cetirizine (ZYRTEC) 10 MG tablet Take 10 mg by mouth daily.     No current facility-administered medications for this visit.      ALLERGIES: Penicillins; Demerol [meperidine]; and Keflex [cephalexin]  Family History  Problem Relation Age of Onset  . Hypertension Father   . Heart disease Maternal Grandmother   . Arthritis Maternal Grandfather   . Early death Maternal Grandfather     Social History   Socioeconomic History  . Marital status: Married    Spouse name: Mitzi Hansen  . Number of children: 2  . Years of education: 78  . Highest  education level: Not on file  Social Needs  . Financial resource strain: Not on file  . Food insecurity - worry: Not on file  . Food insecurity - inability: Not on file  . Transportation needs - medical: Not on file  . Transportation needs - non-medical: Not on file  Occupational History  . Occupation: Music therapist  Tobacco Use  . Smoking status: Never Smoker  . Smokeless tobacco: Never Used  Substance and Sexual Activity  . Alcohol use: Yes    Alcohol/week: 1.2 - 1.8 oz    Types: 2 - 3 Glasses of wine per week  . Drug use: No  . Sexual activity: Yes    Partners: Male    Birth control/protection: Post-menopausal    Comment: Married  Other Topics Concern  . Not on file  Social History Narrative   Married to [place]. 2 children Hart Carwin and [place].    Master's degree.  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    Drinks caffeine. Uses herbal remedies.    exercises routinely.    wears her seatbelt, bicycle helmet, smoke detector in the home.    Feels safe in her relationships.     Review of Systems  Constitutional: Negative.   HENT: Negative.   Eyes: Negative.  Respiratory: Negative.   Cardiovascular: Negative.   Gastrointestinal: Negative.   Genitourinary: Negative.   Musculoskeletal: Negative.   Skin: Negative.   Neurological: Negative.   Endo/Heme/Allergies: Negative.   Psychiatric/Behavioral: Negative.     Ultrasound images reviewed, retroverted uterus, several small myomas, endometrium indistinct no obvious masses.   PHYSICAL EXAMINATION:    LMP 02/24/2011     General appearance: alert, cooperative and appears stated age  Pelvic: External genitalia:  no lesions              Urethra:  normal appearing urethra with no masses, tenderness or lesions              Bartholins and Skenes: normal                 Vagina: normal appearing vagina with normal color and discharge, no lesions              Cervix: no lesions  Sonohysterogram The procedure and risks of the  procedure were reviewed with the patient, consent form was signed. A speculum was placed in the vagina and the cervix was cleansed with Hibiclens, the catheter could not be placed into the endometrial cavity. A single tooth tenaculum was placed on the cervix and the cervix was dilated with the mini-dilators and ultrasound guidance. The uterine evacuator catheter was then inserted into the endometrial cavity with ultrasound guidance. The single tooth tenaculum and speculum were removed. Saline was infused under direct observation with the ultrasound. No intracavitary defects were noted.The endometrium was universally thin. Unable to get great distention of the cervical canal. The catheter was removed.   Chaperone was present for exam.  ASSESSMENT Postmenopausal bleeding, benign cervical polyps removed.  Negative sonohyserogram.  Cervical stenosis, required cervical dilation  PLAN Follow up if she has further bleeding   An After Visit Summary was printed and given to the patient.   CC: Dr Juleen China

## 2017-11-10 ENCOUNTER — Encounter: Payer: No Typology Code available for payment source | Admitting: Family Medicine
